# Patient Record
Sex: Male | Born: 1985 | Hispanic: Yes | Marital: Married | Smoking: Never smoker
Health system: Southern US, Community
[De-identification: ages and names within clinical notes are randomized; demographics above are authoritative.]

## PROBLEM LIST (undated history)

## (undated) HISTORY — PX: NO PAST SURGERIES: SHX2092

---

## 2020-03-15 ENCOUNTER — Ambulatory Visit
Admission: EM | Admit: 2020-03-15 | Discharge: 2020-03-15 | Disposition: A | Discharge status: Home / Self Care | Payer: BC Managed Care – PPO | Attending: Family Medicine | Admitting: Family Medicine

## 2020-03-15 ENCOUNTER — Other Ambulatory Visit: Payer: Self-pay

## 2020-03-15 ENCOUNTER — Ambulatory Visit (INDEPENDENT_AMBULATORY_CARE_PROVIDER_SITE_OTHER): Payer: BC Managed Care – PPO

## 2020-03-15 DIAGNOSIS — S86899A Other injury of other muscle(s) and tendon(s) at lower leg level, unspecified leg, initial encounter: Secondary | ICD-10-CM | POA: Diagnosis not present

## 2020-03-15 MED ORDER — MELOXICAM 15 MG PO TABS: 15.0000 mg | ORAL_TABLET | Freq: Every day | ORAL | 0 refills | Status: DC

## 2020-03-15 NOTE — ED Provider Notes (Signed)
[facility]    CSN: [id number] Arrival date & time: 03/15/20  1623      History   Chief Complaint Chief Complaint  Patient presents with  . Knee Pain    HPI Vernon Brewer is a 34 y.o. male.   34 yo male with a c/o right shin pain for the past 1-2 weeks. Denies any specific traumatic injury. States he plays soccer but not new (has been playing for years). Denies using new shoes or playing on a different surface.    Knee Pain   History reviewed. No pertinent past medical history.  There are no problems to display for this patient.   Past Surgical History:  Procedure Laterality Date  . NO PAST SURGERIES         Home Medications    Prior to Admission medications   Medication Sig Start Date End Date Taking? Authorizing Provider  meloxicam (MOBIC) 15 MG tablet Take 1 tablet (15 mg total) by mouth daily. 03/15/20   Payton Mccallum, MD    Family History Family History  Problem Relation Age of Onset  . Healthy Mother     Social History Social History   Tobacco Use  . Smoking status: Never Smoker  . Smokeless tobacco: Never Used  Vaping Use  . Vaping Use: Never used  Substance Use Topics  . Alcohol use: Yes    Comment: occasionally  . Drug use: Never     Allergies   Patient has no known allergies.   Review of Systems Review of Systems   Physical Exam Triage Vital Signs ED Triage Vitals  Enc Vitals Group     BP 03/15/20 1726 (!) 149/81     Pulse Rate 03/15/20 1726 73     Resp 03/15/20 1726 18     Temp 03/15/20 1726 98.4 F (36.9 C)     Temp Source 03/15/20 1726 Oral     SpO2 03/15/20 1726 100 %     Weight 03/15/20 1723 (!) 209 lb 9.6 oz (95.1 kg)     Height --      Head Circumference --      Peak Flow --      Pain Score 03/15/20 1723 8     Pain Loc --      Pain Edu? --      Excl. in GC? --    No data found.  Updated Vital Signs BP (!) 149/81 (BP Location: Left Arm)   Pulse 73   Temp 98.4 F (36.9 C) (Oral)   Resp  18   Wt (!) 95.1 kg   SpO2 100%   Visual Acuity Right Eye Distance:   Left Eye Distance:   Bilateral Distance:    Right Eye Near:   Left Eye Near:    Bilateral Near:     Physical Exam Vitals and nursing note reviewed.  Constitutional:      General: He is not in acute distress.    Appearance: He is not toxic-appearing or diaphoretic.  Musculoskeletal:     Right lower leg: Bony tenderness (over the mid to lower tibia) present. No swelling, deformity or lacerations. No edema.     Comments: Right lower extremity neurovascularly intact  Neurological:     Mental Status: He is alert.      UC Treatments / Results  Labs (all labs ordered are listed, but only abnormal results are displayed) Labs Reviewed - No data to display  EKG   Radiology DG Tibia/Fibula Right  Result  Date: 03/15/2020 CLINICAL DATA:  Leg pain for 1 week. EXAM: RIGHT TIBIA AND FIBULA - 2 VIEW COMPARISON:  None. FINDINGS: Cortical margins of the tibia and fibula are intact. There is no evidence of fracture or other focal bone lesions. Incidental growth arrest lines in the mid tibial shaft. Knee and ankle alignment are maintained. Soft tissues are unremarkable. IMPRESSION: Negative radiographs of the right lower leg. Electronically Signed   By: Narda Rutherford M.D.   On: 03/15/2020 18:55    Procedures Procedures (including critical care time)  Medications Ordered in UC Medications - No data to display  Initial Impression / Assessment and Plan / UC Course  I have reviewed the triage vital signs and the nursing notes.  Pertinent labs & imaging results that were available during my care of the patient were reviewed by me and considered in my medical decision making (see chart for details).      Final Clinical Impressions(s) / UC Diagnoses   Final diagnoses:  Anterior shin splints    ED Prescriptions    Medication Sig Dispense Auth. Provider   meloxicam (MOBIC) 15 MG tablet Take 1 tablet (15 mg  total) by mouth daily. 30 tablet Payton Mccallum, MD      1. x-ray results and diagnosis reviewed with patient 2. rx as per orders above; reviewed possible side effects, interactions, risks and benefits  3. Recommend supportive treatment with rest, ice 4. Follow-up prn if symptoms worsen or don't improve   PDMP not reviewed this encounter.   Payton Mccallum, MD 03/15/20 1902

## 2020-03-15 NOTE — Discharge Instructions (Addendum)
Rest, ice, stretches

## 2020-03-15 NOTE — ED Triage Notes (Signed)
Patient complains of bilateral knee pain and bilateral leg pain that started 1 week ago. Patient denies any known injury.

## 2020-03-21 ENCOUNTER — Ambulatory Visit
Admission: EM | Admit: 2020-03-21 | Discharge: 2020-03-21 | Disposition: A | Discharge status: Home / Self Care | Payer: BC Managed Care – PPO | Attending: Family Medicine | Admitting: Family Medicine

## 2020-03-21 ENCOUNTER — Encounter: Payer: Self-pay | Admitting: Emergency Medicine

## 2020-03-21 ENCOUNTER — Other Ambulatory Visit: Payer: Self-pay

## 2020-03-21 DIAGNOSIS — J029 Acute pharyngitis, unspecified: Secondary | ICD-10-CM | POA: Diagnosis present

## 2020-03-21 DIAGNOSIS — Z20822 Contact with and (suspected) exposure to covid-19: Secondary | ICD-10-CM | POA: Diagnosis not present

## 2020-03-21 DIAGNOSIS — H9209 Otalgia, unspecified ear: Secondary | ICD-10-CM | POA: Diagnosis not present

## 2020-03-21 DIAGNOSIS — J988 Other specified respiratory disorders: Secondary | ICD-10-CM | POA: Diagnosis not present

## 2020-03-21 DIAGNOSIS — Z791 Long term (current) use of non-steroidal anti-inflammatories (NSAID): Secondary | ICD-10-CM | POA: Diagnosis not present

## 2020-03-21 LAB — INFLUENZA PANEL BY PCR (TYPE A & B)
Influenza A By PCR: NEGATIVE
Influenza B By PCR: NEGATIVE

## 2020-03-21 LAB — GROUP A STREP BY PCR: Group A Strep by PCR: NOT DETECTED

## 2020-03-21 LAB — SARS CORONAVIRUS 2 AG (30 MIN TAT): SARS Coronavirus 2 Ag: NEGATIVE

## 2020-03-21 MED ORDER — KETOROLAC TROMETHAMINE 10 MG PO TABS: 10.0000 mg | ORAL_TABLET | Freq: Four times a day (QID) | ORAL | 0 refills | Status: AC | PRN

## 2020-03-21 MED ORDER — DOXYCYCLINE HYCLATE 100 MG PO CAPS: 100.0000 mg | ORAL_CAPSULE | Freq: Two times a day (BID) | ORAL | 0 refills | Status: AC

## 2020-03-21 NOTE — ED Triage Notes (Signed)
Patient, c/o sore throat, bodyaches, congestion and bilateral ear pain that started on Friday.  Patient reports fevers.

## 2020-03-21 NOTE — Discharge Instructions (Addendum)
Flu and rapid covid negative. Strep negative.  I am placing you on antibiotics while awaiting COVID test result.   Take care  Dr. Adriana Simas

## 2020-03-21 NOTE — ED Provider Notes (Signed)
[facility]    CSN: [id number] Arrival date & time: 03/21/20  1233      History   Chief Complaint Chief Complaint  Patient presents with  . Sore Throat  . Otalgia   HPI  34 year old male presents with multiple complaints.  Patient reports that his symptoms started on Friday.  He reports sore throat, body aches, congestion, ear pain, fever.  He states that he feels very poorly.  His daughter has recently been sick with a febrile illness.  He states that she was treated with amoxicillin.  He is unsure of what she was diagnosed with.  No reported exposures to COVID-19.  Pain 7/10 in severity.  No relieving factors.  Temperature currently 99.9.   Home Medications    Prior to Admission medications   Medication Sig Start Date End Date Taking? Authorizing Provider  doxycycline (VIBRAMYCIN) 100 MG capsule Take 1 capsule (100 mg total) by mouth 2 (two) times daily. 03/21/20   Tommie Sams, DO  ketorolac (TORADOL) 10 MG tablet Take 1 tablet (10 mg total) by mouth every 6 (six) hours as needed for moderate pain or severe pain. 03/21/20   Tommie Sams, DO    Family History Family History  Problem Relation Age of Onset  . Healthy Mother     Social History Social History   Tobacco Use  . Smoking status: Never Smoker  . Smokeless tobacco: Never Used  Vaping Use  . Vaping Use: Never used  Substance Use Topics  . Alcohol use: Yes    Comment: occasionally  . Drug use: Never     Allergies   Patient has no known allergies.   Review of Systems Review of Systems  Constitutional: Positive for fever.  HENT: Positive for congestion and sore throat.   Respiratory: Positive for cough.   Musculoskeletal:       Body aches.   Physical Exam Triage Vital Signs ED Triage Vitals  Enc Vitals Group     BP 03/21/20 1250 (!) 143/87     Pulse Rate 03/21/20 1250 (!) 108     Resp 03/21/20 1250 16     Temp 03/21/20 1250 99.9 F (37.7 C)     Temp Source 03/21/20 1250 Oral      SpO2 03/21/20 1250 100 %     Weight --      Height --      Head Circumference --      Peak Flow --      Pain Score 03/21/20 1248 7     Pain Loc --      Pain Edu? --      Excl. in GC? --    Updated Vital Signs BP (!) 143/87 (BP Location: Left Arm)   Pulse (!) 108   Temp 99.9 F (37.7 C) (Oral)   Resp 16   SpO2 100%   Visual Acuity Right Eye Distance:   Left Eye Distance:   Bilateral Distance:    Right Eye Near:   Left Eye Near:    Bilateral Near:     Physical Exam Vitals and nursing note reviewed.  Constitutional:      Comments: Appears ill.  HENT:     Head: Normocephalic and atraumatic.     Right Ear: Tympanic membrane normal.     Left Ear: Tympanic membrane normal.     Mouth/Throat:     Pharynx: Oropharynx is clear. Posterior oropharyngeal erythema present.  Eyes:     General:  Right eye: No discharge.        Left eye: No discharge.     Conjunctiva/sclera: Conjunctivae normal.  Cardiovascular:     Rate and Rhythm: Regular rhythm. Tachycardia present.  Pulmonary:     Effort: Pulmonary effort is normal.     Breath sounds: Normal breath sounds. No wheezing, rhonchi or rales.  Neurological:     Mental Status: He is alert.  Psychiatric:        Mood and Affect: Mood normal.        Behavior: Behavior normal.    UC Treatments / Results  Labs (all labs ordered are listed, but only abnormal results are displayed) Labs Reviewed  GROUP A STREP BY PCR  SARS CORONAVIRUS 2 AG (30 MIN TAT)  SARS CORONAVIRUS 2 (TAT 6-24 HRS)  INFLUENZA PANEL BY PCR (TYPE A & B)    EKG   Radiology No results found.  Procedures Procedures (including critical care time)  Medications Ordered in UC Medications - No data to display  Initial Impression / Assessment and Plan / UC Course  I have reviewed the triage vital signs and the nursing notes.  Pertinent labs & imaging results that were available during my care of the patient were reviewed by me and considered in my  medical decision making (see chart for details).    34 year old male presents with respiratory infection.  Appears ill.  Strep negative.  Flu negative.  Rapid Covid negative.  Awaiting for Covid PCR test.  Given ill appearance, placing empirically on doxycycline as it covers respiratory pathogen as well.    Final Clinical Impressions(s) / UC Diagnoses   Final diagnoses:  Respiratory infection     Discharge Instructions     Flu and rapid covid negative. Strep negative.  I am placing you on antibiotics while awaiting COVID test result.   Take care  Dr. Adriana Simas     ED Prescriptions    Medication Sig Dispense Auth. Provider   ketorolac (TORADOL) 10 MG tablet Take 1 tablet (10 mg total) by mouth every 6 (six) hours as needed for moderate pain or severe pain. 20 tablet Ivi Griffith G, DO   doxycycline (VIBRAMYCIN) 100 MG capsule Take 1 capsule (100 mg total) by mouth 2 (two) times daily. 14 capsule Everlene Other G, DO     PDMP not reviewed this encounter.   Tommie Sams, [place] 03/21/20 1406

## 2020-03-22 LAB — SARS CORONAVIRUS 2 (TAT 6-24 HRS): SARS Coronavirus 2: NEGATIVE

## 2021-06-02 IMAGING — CR DG TIBIA/FIBULA 2V*R*
2 series · 2 of 2 positions shown · non-contrast
Comparison: None.

CLINICAL DATA: Leg pain for 1 week.

EXAM:
RIGHT TIBIA AND FIBULA - 2 VIEW

[tibia ap]
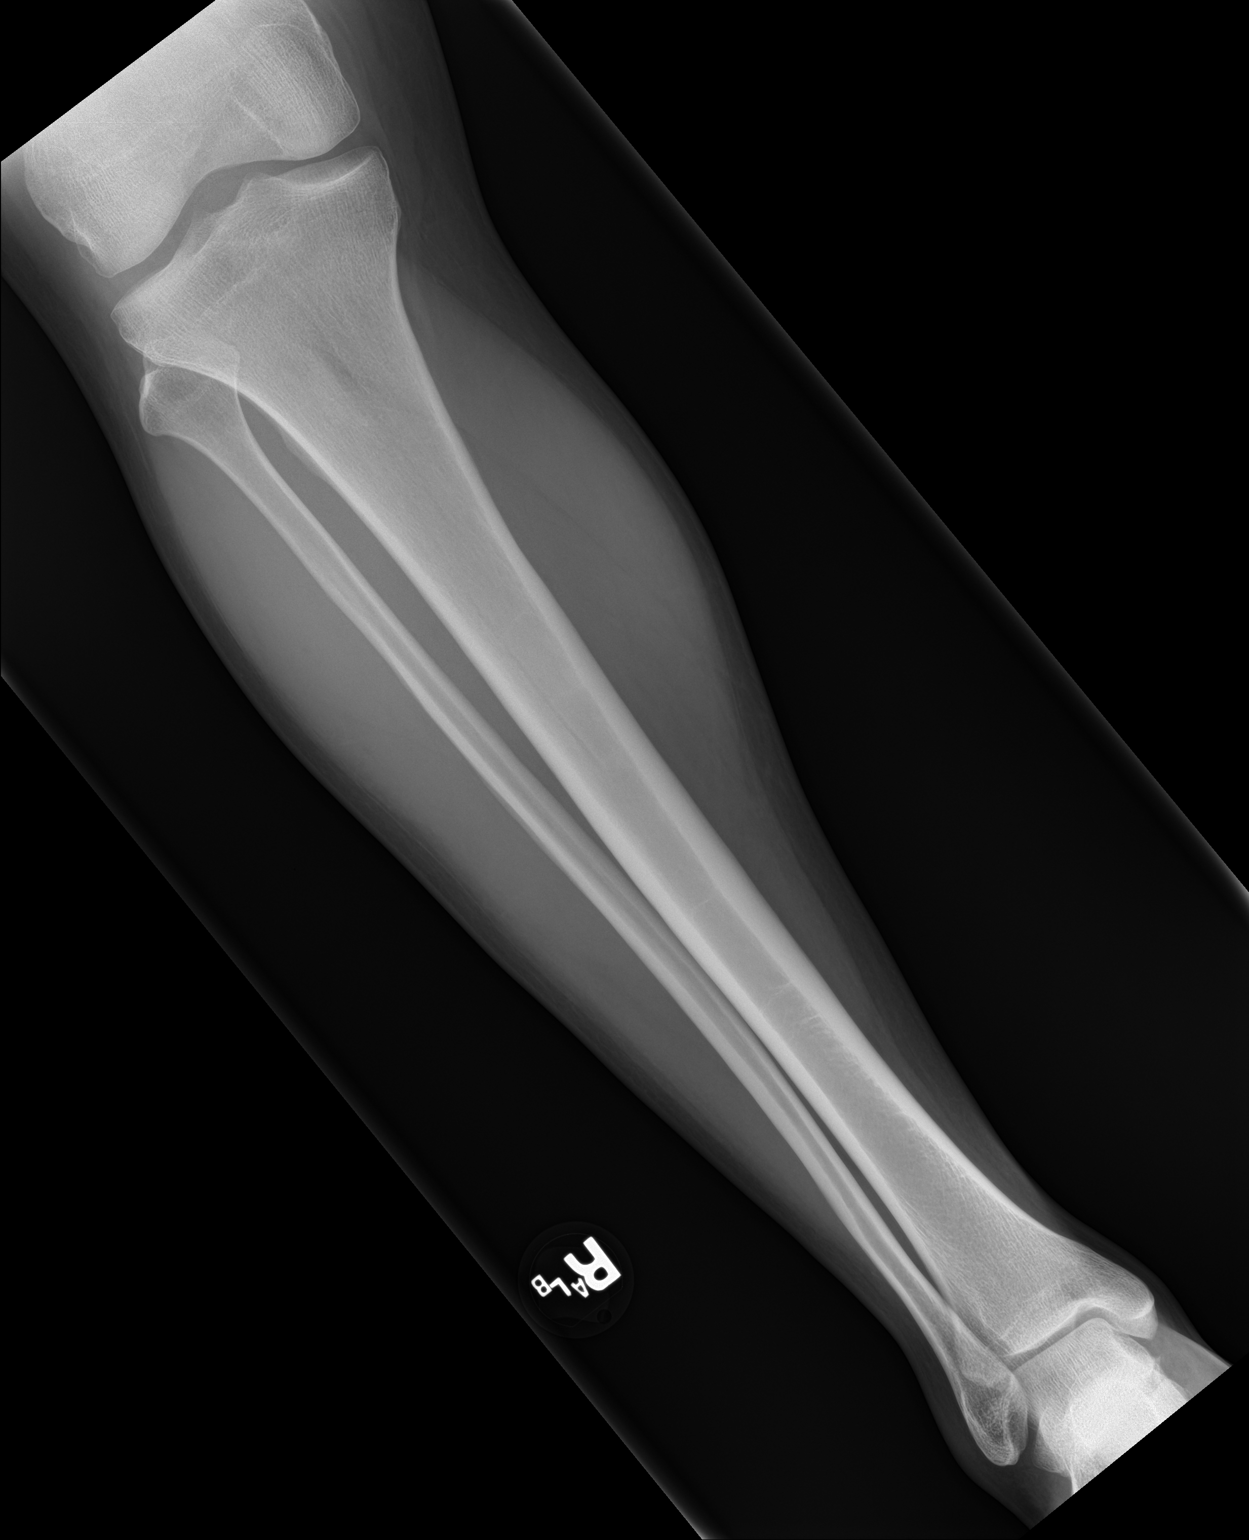

[tibia lat]
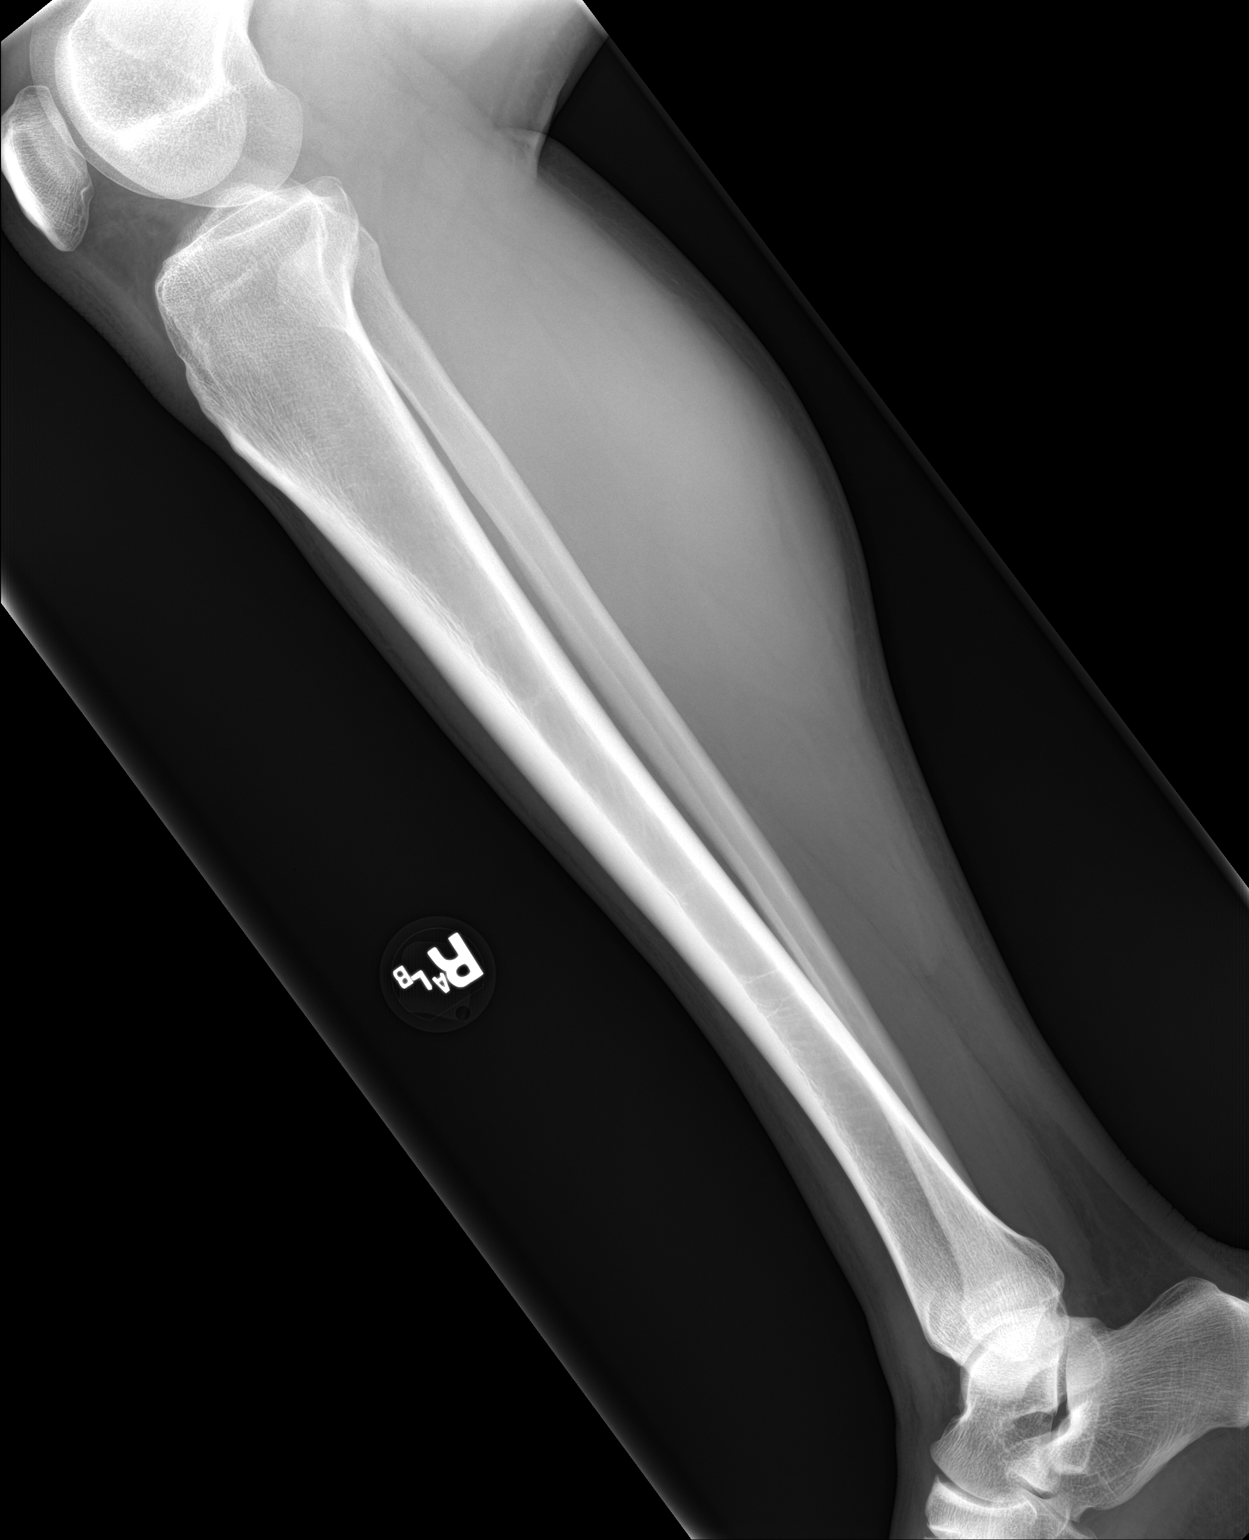

[2 of 2 positions shown; findings below may reference images not displayed]

FINDINGS: Cortical margins of the tibia and fibula are intact. There is no
evidence of fracture or other focal bone lesions. Incidental growth
arrest lines in the mid tibial shaft. Knee and ankle alignment are
maintained. Soft tissues are unremarkable.
IMPRESSION: Negative radiographs of the right lower leg.
